# Patient Record
Sex: Female | Born: 1995 | Race: White | Hispanic: No | Marital: Single
Health system: Southern US, Community
[De-identification: ages and names within clinical notes are randomized; demographics above are authoritative.]

---
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
## 2014-02-04 ENCOUNTER — Emergency Department: Payer: Self-pay | Admitting: Emergency Medicine

## 2014-02-04 LAB — COMPREHENSIVE METABOLIC PANEL
ALK PHOS: 52 U/L
Albumin: 3.6 g/dL — ABNORMAL LOW (ref 3.8–5.6)
Anion Gap: 9 (ref 7–16)
BILIRUBIN TOTAL: 0.4 mg/dL (ref 0.2–1.0)
BUN: 10 mg/dL (ref 7–18)
Calcium, Total: 8.6 mg/dL — ABNORMAL LOW (ref 9.0–10.7)
Chloride: 106 mmol/L (ref 98–107)
Co2: 27 mmol/L (ref 21–32)
Creatinine: 1.07 mg/dL (ref 0.60–1.30)
EGFR (African American): >60
Glucose: 78 mg/dL (ref 65–99)
Osmolality: 281 (ref 275–301)
POTASSIUM: 3.6 mmol/L (ref 3.5–5.1)
SGOT(AST): 21 U/L (ref 0–26)
SGPT (ALT): 27 U/L
SODIUM: 142 mmol/L (ref 136–145)
Total Protein: 7.5 g/dL (ref 6.4–8.6)

## 2014-02-04 LAB — URINALYSIS, COMPLETE
Bilirubin,UR: NEGATIVE
Glucose,UR: NEGATIVE mg/dL (ref 0–75)
KETONE: NEGATIVE
Nitrite: NEGATIVE
PH: 7 (ref 4.5–8.0)
Protein: NEGATIVE
Specific Gravity: 1.004 (ref 1.003–1.030)
WBC UR: 18 /HPF (ref 0–5)

## 2014-02-04 LAB — CBC WITH DIFFERENTIAL/PLATELET
Basophil #: 0.1 10*3/uL (ref 0.0–0.1)
Basophil %: 1.2 %
EOS ABS: 0.2 10*3/uL (ref 0.0–0.7)
EOS PCT: 3.9 %
HCT: 39.8 % (ref 35.0–47.0)
HGB: 12.7 g/dL (ref 12.0–16.0)
Lymphocyte #: 2.3 10*3/uL (ref 1.0–3.6)
Lymphocyte %: 41.5 %
MCH: 27.4 pg (ref 26.0–34.0)
MCHC: 31.9 g/dL — ABNORMAL LOW (ref 32.0–36.0)
MCV: 86 fL (ref 80–100)
MONO ABS: 0.4 x10 3/mm (ref 0.2–0.9)
Monocyte %: 7.8 %
NEUTROS PCT: 45.6 %
Neutrophil #: 2.5 10*3/uL (ref 1.4–6.5)
Platelet: 306 10*3/uL (ref 150–440)
RBC: 4.63 10*6/uL (ref 3.80–5.20)
RDW: 13.8 % (ref 11.5–14.5)
WBC: 5.5 10*3/uL (ref 3.6–11.0)

## 2014-02-04 LAB — PREGNANCY, URINE: Pregnancy Test, Urine: NEGATIVE m[IU]/mL

## 2014-02-04 LAB — GC/CHLAMYDIA PROBE AMP

## 2014-02-04 LAB — LIPASE, BLOOD: Lipase: 69 U/L — ABNORMAL LOW (ref 73–393)

## 2014-02-04 LAB — WET PREP, GENITAL

## 2014-02-06 LAB — URINE CULTURE

## 2014-03-12 ENCOUNTER — Emergency Department: Payer: Self-pay | Admitting: Emergency Medicine

## 2014-03-12 LAB — URINALYSIS, COMPLETE
BILIRUBIN, UR: NEGATIVE
BLOOD: NEGATIVE
Glucose,UR: NEGATIVE mg/dL (ref 0–75)
Ketone: NEGATIVE
LEUKOCYTE ESTERASE: NEGATIVE
Nitrite: NEGATIVE
PH: 7 (ref 4.5–8.0)
PROTEIN: NEGATIVE
RBC,UR: 1 /HPF (ref 0–5)
Specific Gravity: 1.015 (ref 1.003–1.030)
Squamous Epithelial: 1

## 2014-03-12 LAB — PREGNANCY, URINE: Pregnancy Test, Urine: NEGATIVE m[IU]/mL

## 2014-03-12 LAB — COMPREHENSIVE METABOLIC PANEL
ALBUMIN: 3.8 g/dL (ref 3.8–5.6)
ALK PHOS: 60 U/L
ALT: 28 U/L
Anion Gap: 10 (ref 7–16)
BUN: 9 mg/dL (ref 7–18)
Bilirubin,Total: 0.3 mg/dL (ref 0.2–1.0)
CALCIUM: 9 mg/dL (ref 9.0–10.7)
CHLORIDE: 107 mmol/L (ref 98–107)
CREATININE: 0.76 mg/dL (ref 0.60–1.30)
Co2: 21 mmol/L (ref 21–32)
EGFR (African American): >60
GLUCOSE: 88 mg/dL (ref 65–99)
Osmolality: 274 (ref 275–301)
Potassium: 3.8 mmol/L (ref 3.5–5.1)
SGOT(AST): 31 U/L — ABNORMAL HIGH (ref 0–26)
Sodium: 138 mmol/L (ref 136–145)
Total Protein: 7.7 g/dL (ref 6.4–8.6)

## 2014-03-12 LAB — CBC
HCT: 38.3 % (ref 35.0–47.0)
HGB: 12.8 g/dL (ref 12.0–16.0)
MCH: 27.9 pg (ref 26.0–34.0)
MCHC: 33.5 g/dL (ref 32.0–36.0)
MCV: 83 fL (ref 80–100)
Platelet: 333 10*3/uL (ref 150–440)
RBC: 4.61 10*6/uL (ref 3.80–5.20)
RDW: 13.7 % (ref 11.5–14.5)
WBC: 9.9 10*3/uL (ref 3.6–11.0)

## 2014-03-12 LAB — CK TOTAL AND CKMB (NOT AT ARMC): CK, Total: 73 U/L

## 2014-03-12 LAB — TROPONIN I: Troponin-I: <0.02 ng/mL

## 2014-03-23 ENCOUNTER — Observation Stay: Payer: Self-pay | Admitting: Internal Medicine

## 2014-03-23 LAB — CBC WITH DIFFERENTIAL/PLATELET
BASOS PCT: 0.7 %
Basophil #: 0.1 10*3/uL (ref 0.0–0.1)
EOS ABS: 0.2 10*3/uL (ref 0.0–0.7)
EOS PCT: 1.9 %
HCT: 39.8 % (ref 35.0–47.0)
HGB: 12.7 g/dL (ref 12.0–16.0)
LYMPHS ABS: 2 10*3/uL (ref 1.0–3.6)
Lymphocyte %: 20.2 %
MCH: 27 pg (ref 26.0–34.0)
MCHC: 31.9 g/dL — AB (ref 32.0–36.0)
MCV: 85 fL (ref 80–100)
MONO ABS: 0.8 x10 3/mm (ref 0.2–0.9)
Monocyte %: 8.6 %
NEUTROS ABS: 6.7 10*3/uL — AB (ref 1.4–6.5)
NEUTROS PCT: 68.6 %
Platelet: 377 10*3/uL (ref 150–440)
RBC: 4.71 10*6/uL (ref 3.80–5.20)
RDW: 14 % (ref 11.5–14.5)
WBC: 9.8 10*3/uL (ref 3.6–11.0)

## 2014-03-23 LAB — URINALYSIS, COMPLETE
BACTERIA: NONE SEEN
Bilirubin,UR: NEGATIVE
Blood: NEGATIVE
GLUCOSE, UR: NEGATIVE mg/dL (ref 0–75)
Ketone: NEGATIVE
Nitrite: NEGATIVE
PROTEIN: NEGATIVE
Ph: 7 (ref 4.5–8.0)
RBC,UR: 8 /HPF (ref 0–5)
Specific Gravity: 1.026 (ref 1.003–1.030)
WBC UR: 5 /HPF (ref 0–5)

## 2014-03-23 LAB — DRUG SCREEN, URINE
AMPHETAMINES, UR SCREEN: NEGATIVE (ref ?–1000)
Barbiturates, Ur Screen: NEGATIVE (ref ?–200)
Benzodiazepine, Ur Scrn: NEGATIVE (ref ?–200)
CANNABINOID 50 NG, UR ~~LOC~~: NEGATIVE (ref ?–50)
COCAINE METABOLITE, UR ~~LOC~~: NEGATIVE (ref ?–300)
MDMA (Ecstasy)Ur Screen: NEGATIVE (ref ?–500)
Methadone, Ur Screen: NEGATIVE (ref ?–300)
Opiate, Ur Screen: NEGATIVE (ref ?–300)
Phencyclidine (PCP) Ur S: NEGATIVE (ref ?–25)
Tricyclic, Ur Screen: NEGATIVE (ref ?–1000)

## 2014-03-23 LAB — COMPREHENSIVE METABOLIC PANEL
ANION GAP: 7 (ref 7–16)
Albumin: 4.1 g/dL (ref 3.8–5.6)
Alkaline Phosphatase: 75 U/L
BILIRUBIN TOTAL: 0.3 mg/dL (ref 0.2–1.0)
BUN: 13 mg/dL (ref 7–18)
CALCIUM: 8.9 mg/dL — AB (ref 9.0–10.7)
Chloride: 107 mmol/L (ref 98–107)
Co2: 25 mmol/L (ref 21–32)
Creatinine: 0.88 mg/dL (ref 0.60–1.30)
EGFR (Non-African Amer.): >60
GLUCOSE: 89 mg/dL (ref 65–99)
Osmolality: 277 (ref 275–301)
POTASSIUM: 3.9 mmol/L (ref 3.5–5.1)
SGOT(AST): 57 U/L — ABNORMAL HIGH (ref 0–26)
SGPT (ALT): 124 U/L — ABNORMAL HIGH
Sodium: 139 mmol/L (ref 136–145)
TOTAL PROTEIN: 8 g/dL (ref 6.4–8.6)

## 2014-03-23 LAB — TROPONIN I
Troponin-I: <0.02 ng/mL
Troponin-I: <0.02 ng/mL

## 2014-03-23 LAB — CK TOTAL AND CKMB (NOT AT ARMC)
CK, Total: 50 U/L
CK, Total: 46 U/L
CK-MB: <0.5 ng/mL — ABNORMAL LOW (ref 0.5–3.6)

## 2014-03-23 LAB — LIPASE, BLOOD: Lipase: 108 U/L (ref 73–393)

## 2014-03-24 DIAGNOSIS — I341 Nonrheumatic mitral (valve) prolapse: Secondary | ICD-10-CM

## 2014-03-24 LAB — CK TOTAL AND CKMB (NOT AT ARMC): CK, TOTAL: 46 U/L

## 2014-03-24 LAB — TROPONIN I: Troponin-I: <0.02 ng/mL

## 2014-06-02 ENCOUNTER — Emergency Department: Payer: Self-pay | Admitting: Emergency Medicine

## 2014-06-02 LAB — DRUG SCREEN, URINE
Amphetamines, Ur Screen: NEGATIVE (ref ?–1000)
Barbiturates, Ur Screen: NEGATIVE (ref ?–200)
Benzodiazepine, Ur Scrn: NEGATIVE (ref ?–200)
Cannabinoid 50 Ng, Ur ~~LOC~~: NEGATIVE (ref ?–50)
Cocaine Metabolite,Ur ~~LOC~~: NEGATIVE (ref ?–300)
MDMA (Ecstasy)Ur Screen: POSITIVE (ref ?–500)
Methadone, Ur Screen: NEGATIVE (ref ?–300)
Opiate, Ur Screen: NEGATIVE (ref ?–300)
Phencyclidine (PCP) Ur S: NEGATIVE (ref ?–25)
Tricyclic, Ur Screen: NEGATIVE (ref ?–1000)

## 2014-06-02 LAB — COMPREHENSIVE METABOLIC PANEL
ALBUMIN: 4.4 g/dL (ref 3.8–5.6)
ALT: 55 U/L
Alkaline Phosphatase: 77 U/L
Anion Gap: 7 (ref 7–16)
BUN: 11 mg/dL (ref 7–18)
Bilirubin,Total: 0.4 mg/dL (ref 0.2–1.0)
CREATININE: 1.12 mg/dL (ref 0.60–1.30)
Calcium, Total: 9.1 mg/dL (ref 9.0–10.7)
Chloride: 104 mmol/L (ref 98–107)
Co2: 29 mmol/L (ref 21–32)
Glucose: 96 mg/dL (ref 65–99)
Osmolality: 279 (ref 275–301)
POTASSIUM: 3.7 mmol/L (ref 3.5–5.1)
SGOT(AST): 52 U/L — ABNORMAL HIGH (ref 0–26)
Sodium: 140 mmol/L (ref 136–145)
Total Protein: 8.7 g/dL — ABNORMAL HIGH (ref 6.4–8.6)

## 2014-06-02 LAB — URINALYSIS, COMPLETE
BILIRUBIN, UR: NEGATIVE
GLUCOSE, UR: NEGATIVE mg/dL (ref 0–75)
Ketone: NEGATIVE
Leukocyte Esterase: NEGATIVE
Nitrite: NEGATIVE
Ph: 8 (ref 4.5–8.0)
Protein: 30
RBC,UR: 20 /HPF (ref 0–5)
SPECIFIC GRAVITY: 1.021 (ref 1.003–1.030)
Squamous Epithelial: 18
WBC UR: 4 /HPF (ref 0–5)

## 2014-06-02 LAB — CBC WITH DIFFERENTIAL/PLATELET
Basophil #: 0.1 10*3/uL (ref 0.0–0.1)
Basophil %: 0.9 %
Eosinophil #: 0.1 10*3/uL (ref 0.0–0.7)
Eosinophil %: 0.7 %
HCT: 39 % (ref 35.0–47.0)
HGB: 12.5 g/dL (ref 12.0–16.0)
Lymphocyte #: 2.5 10*3/uL (ref 1.0–3.6)
Lymphocyte %: 21.7 %
MCH: 26.3 pg (ref 26.0–34.0)
MCHC: 32.1 g/dL (ref 32.0–36.0)
MCV: 82 fL (ref 80–100)
MONOS PCT: 7.7 %
Monocyte #: 0.9 x10 3/mm (ref 0.2–0.9)
NEUTROS ABS: 8 10*3/uL — AB (ref 1.4–6.5)
NEUTROS PCT: 69 %
PLATELETS: 388 10*3/uL (ref 150–440)
RBC: 4.75 10*6/uL (ref 3.80–5.20)
RDW: 14.8 % — ABNORMAL HIGH (ref 11.5–14.5)
WBC: 11.6 10*3/uL — AB (ref 3.6–11.0)

## 2014-06-02 LAB — PROTIME-INR
INR: 1
Prothrombin Time: 13.2 secs (ref 11.5–14.7)

## 2014-06-02 LAB — CK TOTAL AND CKMB (NOT AT ARMC)
CK, Total: 88 U/L (ref 26–192)
CK-MB: <0.5 ng/mL — ABNORMAL LOW (ref 0.5–3.6)

## 2014-06-02 LAB — PREGNANCY, URINE: Pregnancy Test, Urine: NEGATIVE m[IU]/mL

## 2014-06-02 LAB — D-DIMER(ARMC): D-Dimer: 116 ng/mL

## 2014-06-02 LAB — TROPONIN I: Troponin-I: <0.02 ng/mL

## 2014-09-06 ENCOUNTER — Emergency Department: Payer: Self-pay | Admitting: Emergency Medicine

## 2014-10-15 NOTE — H&P (Signed)
PATIENT NAME:  Ellen Buchanan, Ellen Buchanan MR#:  [id number] DATE OF BIRTH:  [birth year]  DATE OF ADMISSION:  03/23/2014  REFERRING PHYSICIAN: Gladstone Pih, MD  PRIMARY CARE PHYSICIAN: Letitia Caul, MD   CHIEF COMPLAINT: Chest pain.   HISTORY OF PRESENT ILLNESS: Ellen Buchanan is a 19 year old female with a history of congenital aortic stenosis followed at [facility] who comes to the Emergency Department with complaints of chest pain. 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 The patient states continues to have constant pain on the whole left side of the chest associated with shortness of breath. The patient states that this was associated with dizziness and had an episode of syncope. The patient denies having any palpitations, nausea, vomiting, abdominal pain. The patient denies any previous episodes of syncope. The patient states has been experiencing chest pain for the last 6 months off and on. Gets this chest pain. Usually lasts about 2 to 3 days. Without any intervention the pain resolves. The patient was told that the patient has mild aortic stenosis, congenital.   PAST MEDICAL HISTORY:  1.  Congenital aortic valve stenosis.  2.  Asthma.   PAST SURGICAL HISTORY: None.   ALLERGIES: 1.  HYDROCODONE.  2.  MORPHINE.  3.  PREDNISONE.  4.  ALBUTEROL.   HOME MEDICATIONS:  1.  Prilosec 40 mg once a day.  2.  Meloxicam 7.5 mg daily.  3.  Ativan 0.5 mg 2 times a day as needed.   SOCIAL HISTORY: No history of smoking, drinking alcohol, or using illicit drugs. Lives with her grandmother. Works at [employer].   FAMILY HISTORY: The patient states both parents are healthy.   REVIEW OF SYSTEMS:  CONSTITUTIONAL: Denies any generalized weakness.  EYES: No change in vision.  ENT: No change in hearing.  RESPIRATORY: No cough, shortness of breath.  CARDIOVASCULAR: Complains of chest pain.  GASTROINTESTINAL: No nausea, vomiting, abdominal pain.  GENITOURINARY: No dysuria or  hematuria.  HEMATOLOGIC: No easy bruising or bleeding.  SKIN: No rash or lesions. MUSCULOSKELETAL: No joint pains and aches.  NEUROLOGIC: No weakness or numbness in any part of the body.   PHYSICAL EXAMINATION:  GENERAL: This is a well-built, well-nourished, age-appropriate female lying down in the bed, not in distress.  VITAL SIGNS: Temperature 98.3, pulse 79, blood pressure 180/70, respiratory rate of 20, oxygen saturation is 99% on room air.  HEENT: Head normocephalic, atraumatic. There is no scleral icterus. Conjunctivae normal. Pupils equal and react to light. Mucous membranes moist. No pharyngeal erythema.  NECK: Supple. No lymphadenopathy. No JVD. No carotid bruit.  CHEST: Has focal tenderness under the left breast. LUNGS: Bilaterally clear to auscultation.  HEART: S1, S2 regular. No murmurs are heard.  ABDOMEN: Bowel sounds plus. Soft, nontender, nondistended.  EXTREMITIES: No pedal edema. Pulses 2+.  NEUROLOGIC: The patient is alert, oriented to place, person, and time. Cranial nerves II through XII intact. Motor 5/5 in upper and lower extremities.   LABORATORIES: Urine drug screen is negative. CBC, CMP are completely within normal limits. Chest x-ray, 1 view, portable: No acute cardiopulmonary disease.   ASSESSMENT AND PLAN: Ellen Buchanan, a 19 year old female who comes with the chest pain. 1.  The patient was told that patient has mild aortic stenosis. Concerning about the patient's syncope, admit the patient to the monitored bed. Obtain echocardiogram in the morning. We will also obtain cardiac enzymes x 3. If workup is negative, the patient could be discharged home. The patient [facility]  [facility].  2.  Keep the patient on deep vein thrombosis prophylaxis with Lovenox.   TIME SPENT: 45 minutes.    ____________________________ Susa GriffinsPadmaja Kurtiss Wence, MD pv:ST D: 03/24/2014 00:05:18 ET T: 03/24/2014 00:26:46 ET JOB#: [id number]  cc: Susa GriffinsPadmaja Katoya Amato,  MD, <Dictator> Susa GriffinsPADMAJA Auren Valdes MD ELECTRONICALLY SIGNED 03/25/2014 22:58

## 2014-10-15 NOTE — Discharge Summary (Signed)
PATIENT NAME:  Ellen Buchanan, Sidda D MR#:  [id number] DATE OF BIRTH:  [birth year]  DATE OF ADMISSION:  03/23/2014 DATE OF DISCHARGE:  03/24/2014.    ADMISSION DIAGNOSIS: Chest pain.   DISCHARGE DIAGNOSES:   1.  Chest pain.   2.  History of congenital heart valve.    PERTINENT LABORATORIES:  Troponins x 3 were negative.   ECHO normal EF 60% with borderline or no aortic stenosis, hard to visualize bicuspid aortic valve.  HOSPITAL COURSE:  A 19 year old female who presented with chest pain.  For details see the H and P.    1.  Chest pain, atypical in nature, unspecified. The patient has a history of congenital aortic stenosis, so she was admitted for an echocardiogram to rule out acute coronary syndrome.  She has been ruled out for acute coronary syndrome and she will be [facility] for continued monitoring of her heart valve Telemetry and EKG were normal.  2.  Congenital aortic stenosis. The patient did obtain an echocardiogram which showed no or mild aortic stenosis. She stopped seeing cardiology 2 years ago. I discussed it would be in her best interest to have periodic cardiology follow up. She agreed and will make an appointment to [facility].  DISCHARGE MEDICATIONS:  1.  Prilosec 40 mg daily.   2.  Ativan 0.5 b.i.d.  3.  Meloxicam 7.5 daily.   DISCHARGE DIET: Regular diet.    DISCHARGE ACTIVITY: As tolerated.   DISCHARGE FOLLOWUP:  The patient is to follow up in 2-4 weeks with [facility] seem to cardiology.   TIME SPENT: Approximately 35 minutes.   Stablee for discharge home. Plan of care discussed with family.  ____________________________ Janyth ContesSital P. Juliene PinaMody, MD spm:bu D: 03/24/2014 12:03:00 ET T: 03/24/2014 16:22:25 ET JOB#: [id number]  cc: Hulet Ehrmann P. Juliene PinaMody, MD, <Dictator> [facility] Yarlin Breisch P Sharnetta Gielow MD ELECTRONICALLY SIGNED 03/24/2014 21:49

## 2016-05-09 IMAGING — US US PELV - US TRANSVAGINAL
1 series · 14 of 25 positions shown · non-contrast
Comparison: CT scan the abdomen and pelvis of today's date.

CLINICAL DATA: Lower abdominal pain



[Series 1: us pelv - us transvaginal · 0.22mm/px · 14 of 82 slices shown]
[im 1/82]
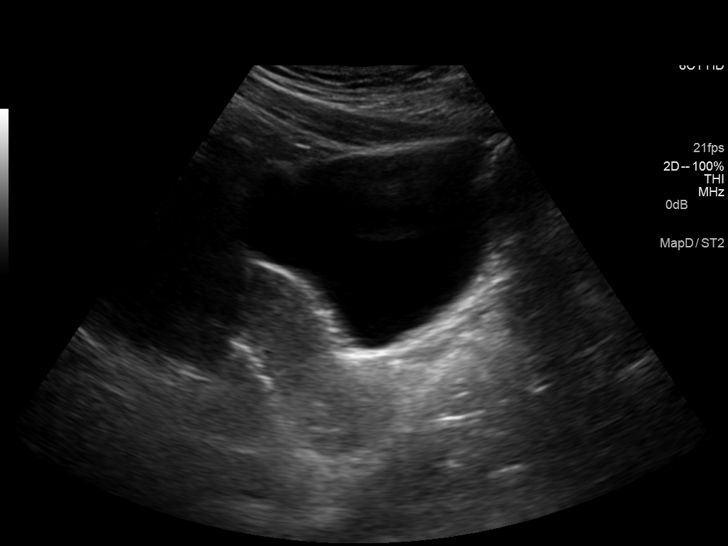
[im 7/82]
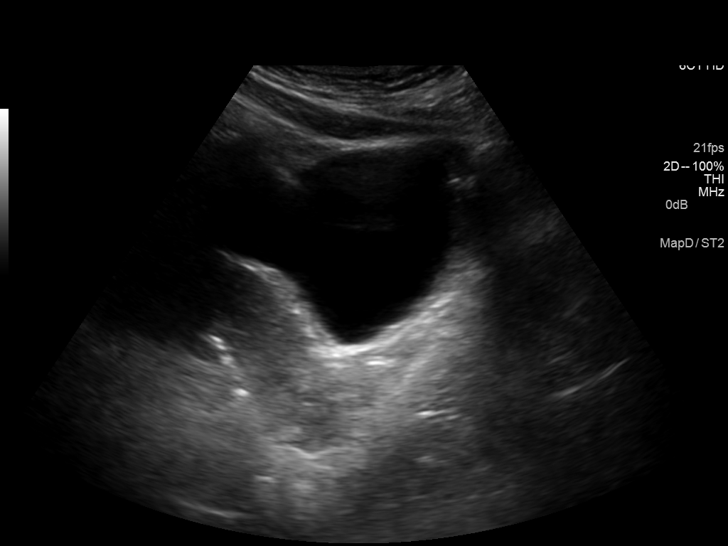
[im 14/82]
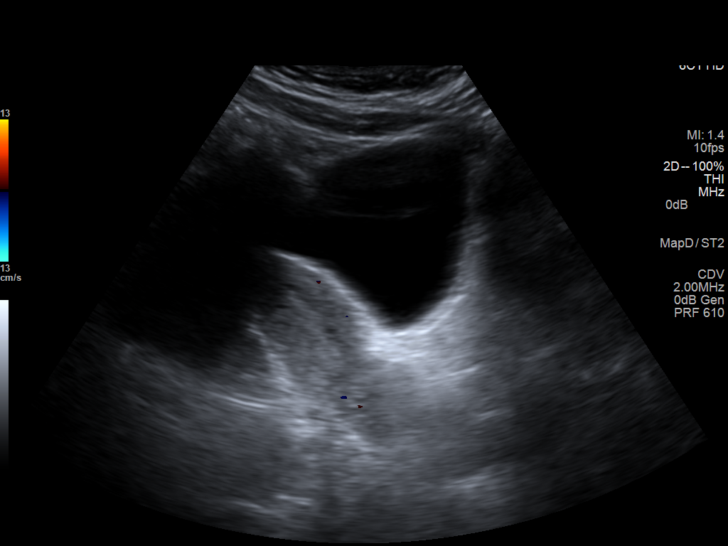
[im 21/82]
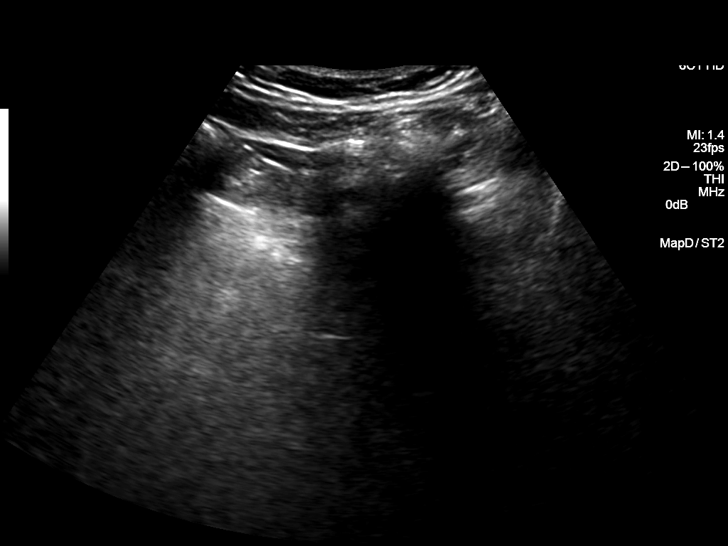
[im 28/82]
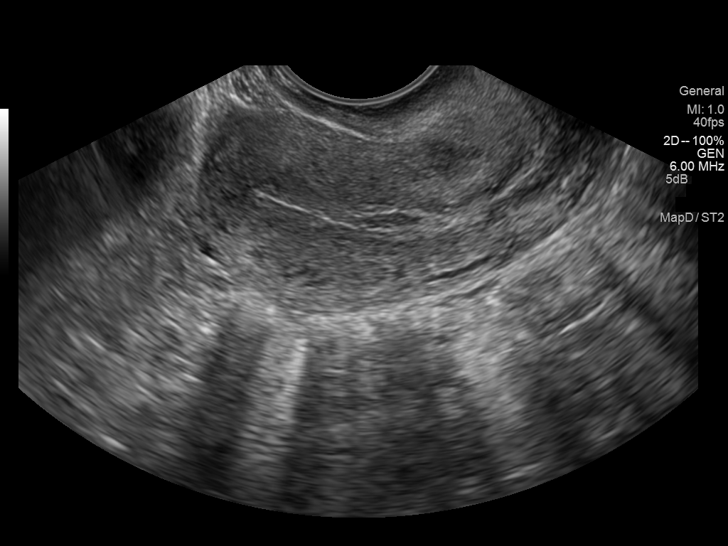
[im 31/82]
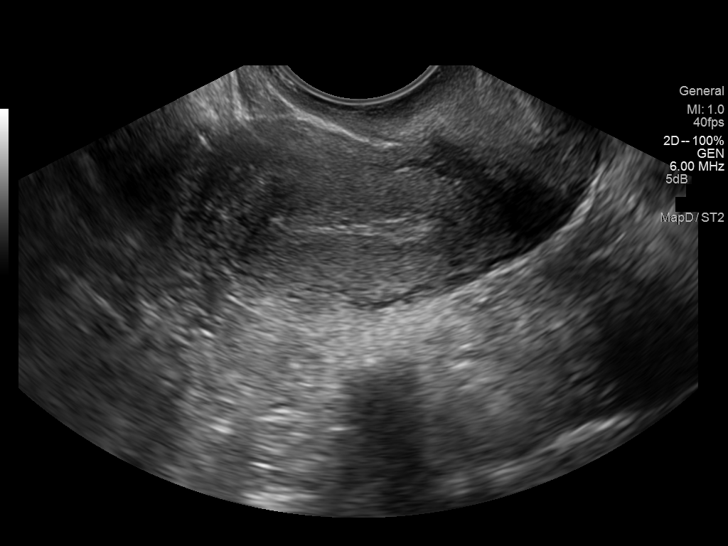
[im 38/82]
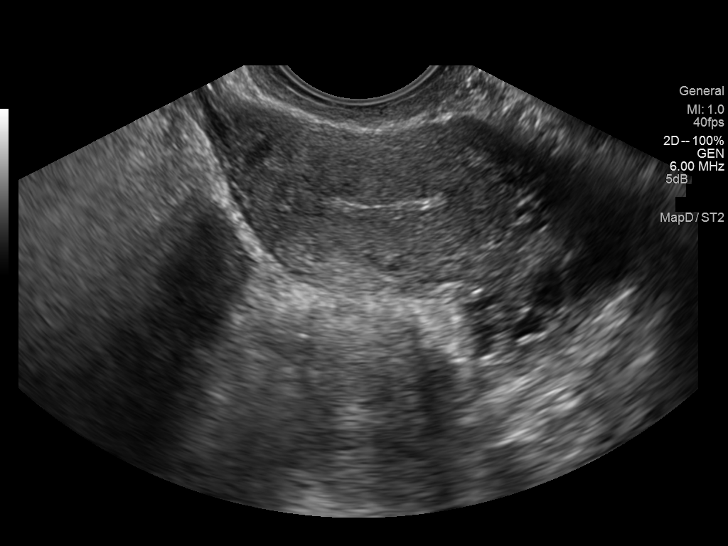
[im 44/82]
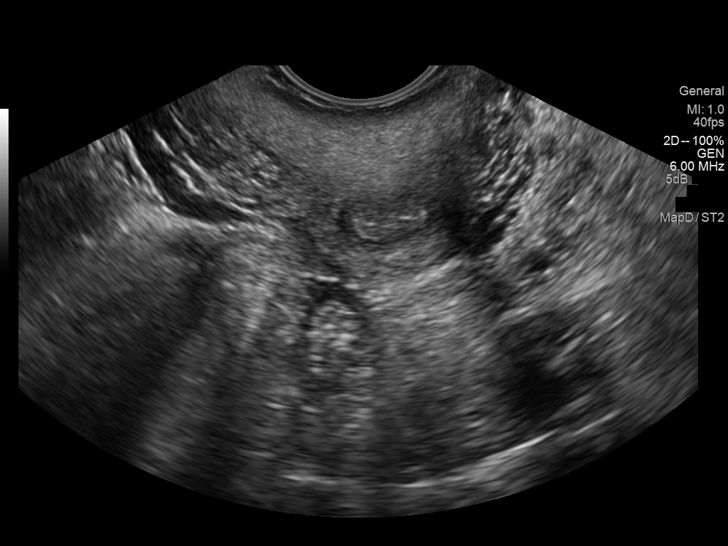
[im 51/82]
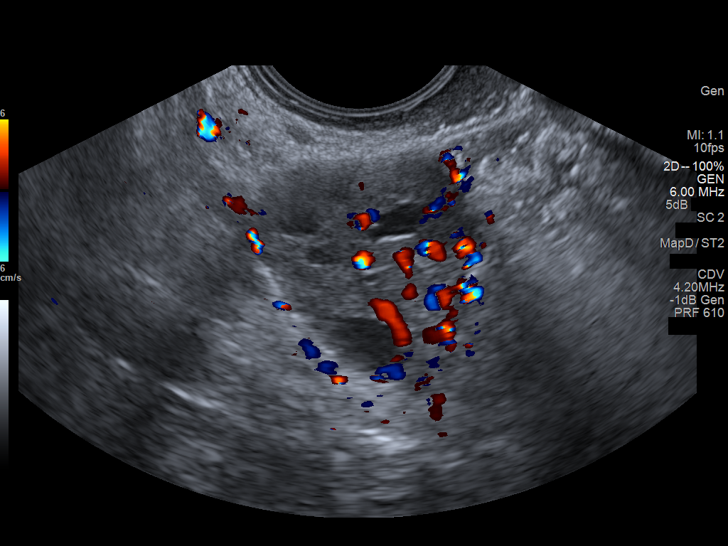
[im 55/82]
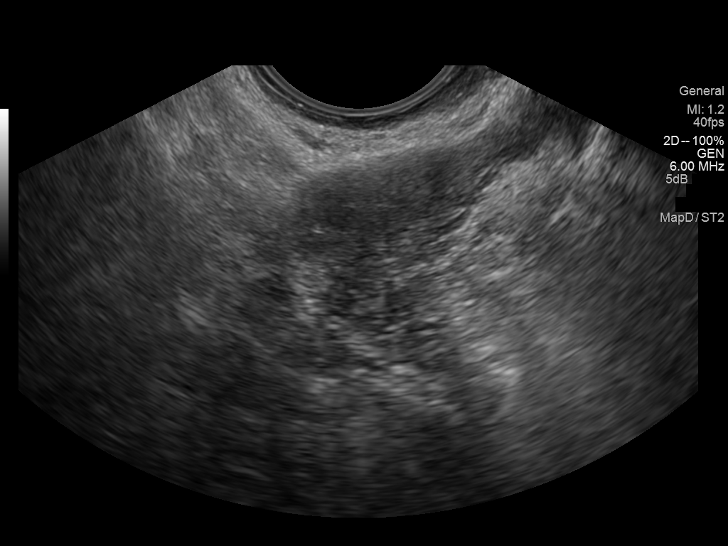
[im 61/82]
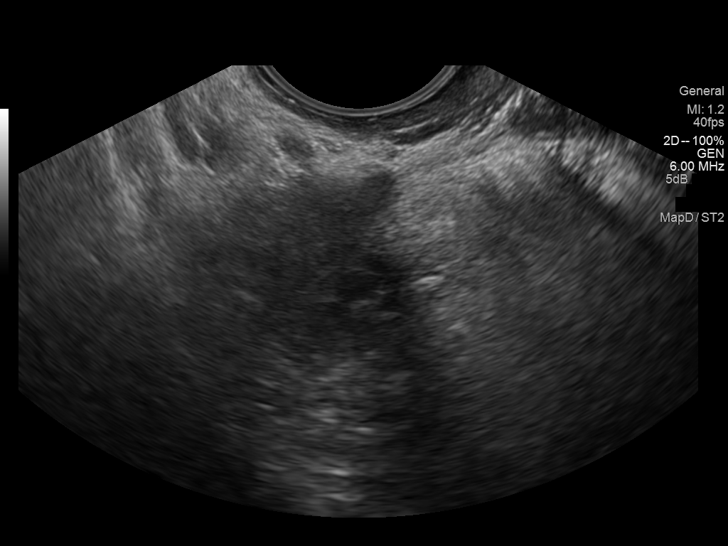
[im 68/82]
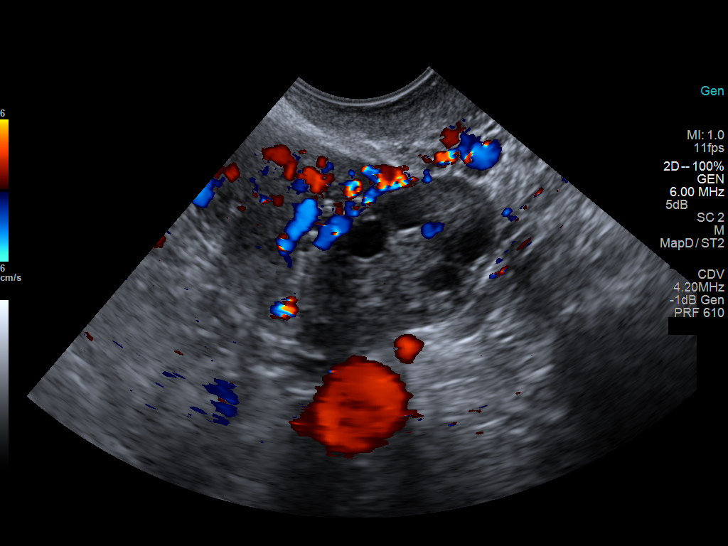
[im 75/82]
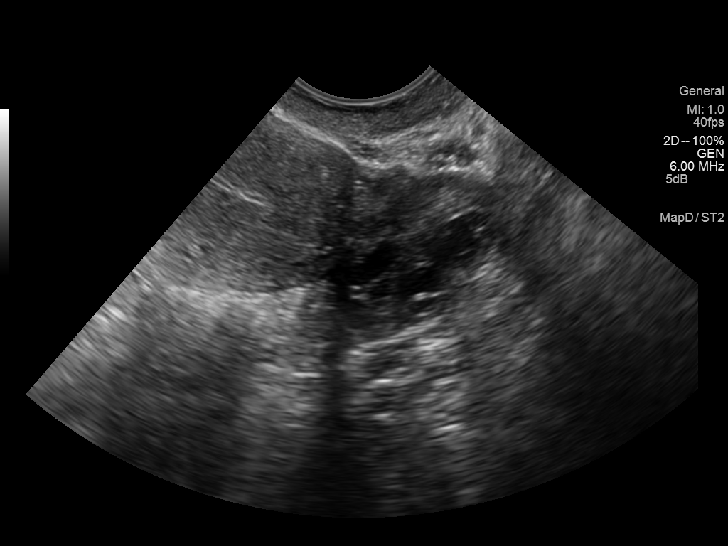
[im 82/82]
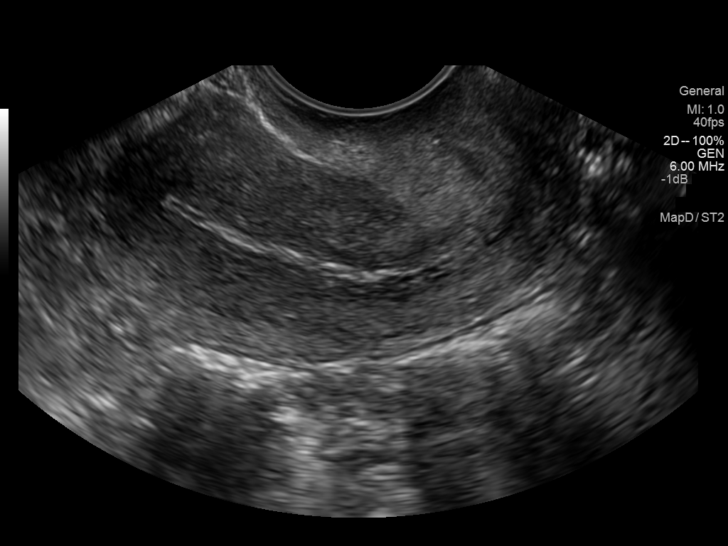

[14 of 25 positions shown; findings below may reference images not displayed]

FINDINGS: Uterus

Measurements: 7.0 x 2.6 x 4.9 cm. No fibroids or other mass
visualized.

Endometrium

Thickness: 5.3 mm.  No focal abnormality visualized.

Right ovary

Measurements: 3.1 x 2.7 x 2.6 cm. There are developing follicles
present. Vascularity is normal.

Left ovary

Measurements: 3.5 x 1.9 x 2.4 cm. Developing follicles are present.
Vascularity is normal.

Other findings

There is a trace of free pelvic fluid.
IMPRESSION: Normal pelvic ultrasound. A tiny amount of free pelvic fluid is
demonstrated. There are no findings to explain the patient's lower
abdominal discomfort.

## 2016-05-09 IMAGING — CT CT ABD-PELV W/ CM
2 of 4 series · 15 of 46 positions shown, 17 images · IV contrast (agent unspecified)
Comparison: None.

CLINICAL DATA: Right lower quadrant pain

EXAM:
CT ABDOMEN AND PELVIS WITH CONTRAST
TECHNIQUE: Multidetector CT imaging of the abdomen and pelvis was performed
using the standard protocol following bolus administration of
intravenous contrast.
CONTRAST:  100 cc of Qsovue-LCC intravenously. The patient also
received oral contrast material.

[Series 2: routine abd pel with · axial · 0.68mm/px · z∈[-546,-150]mm · 12 of 91 slices shown, 14 images]
[im 8/91  soft-tissue]
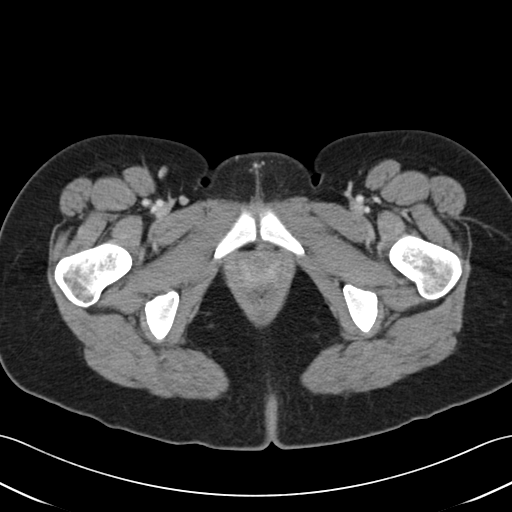
[im 8/91  bone]
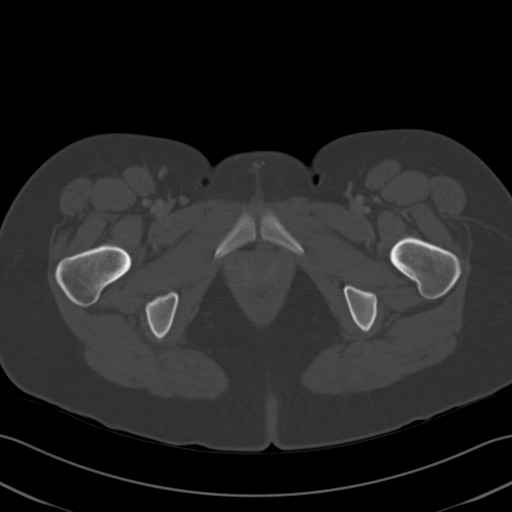
[im 15/91  soft-tissue]
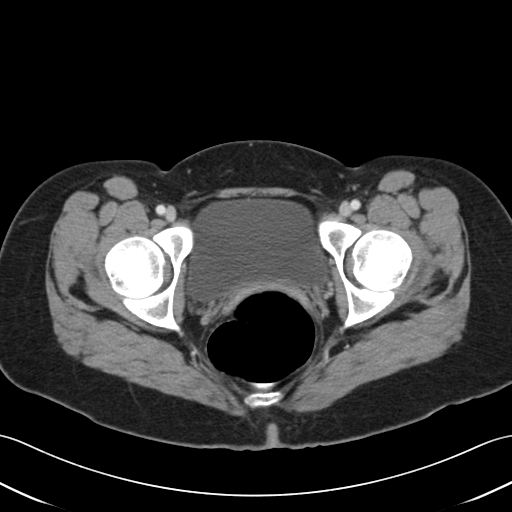
[im 22/91  soft-tissue]
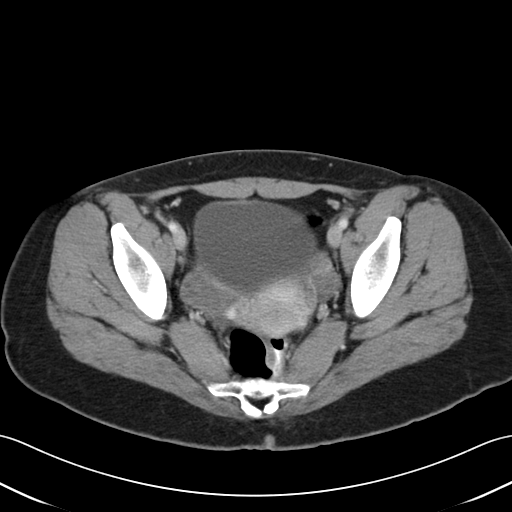
[im 29/91  soft-tissue]
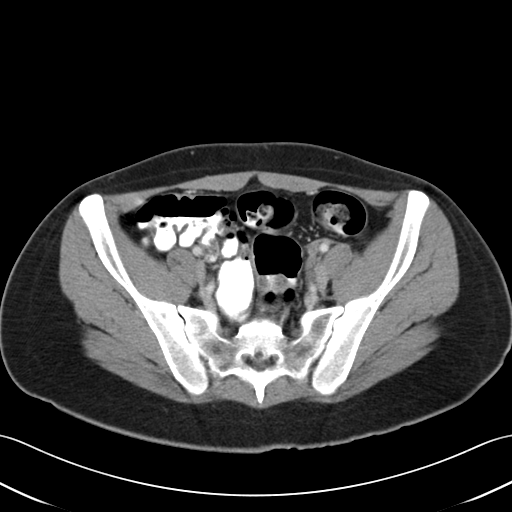
[im 37/91  soft-tissue]
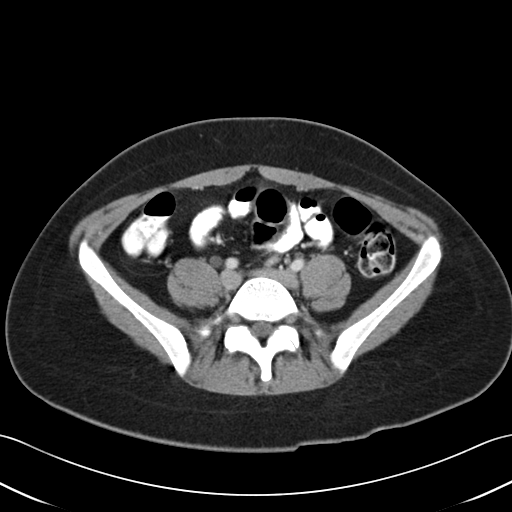
[im 44/91  soft-tissue]
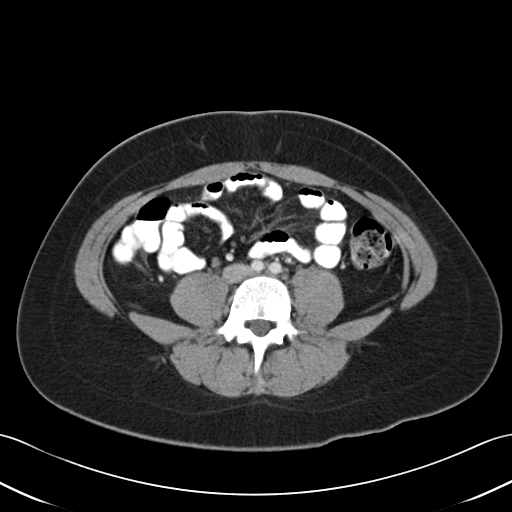
[im 51/91  soft-tissue]
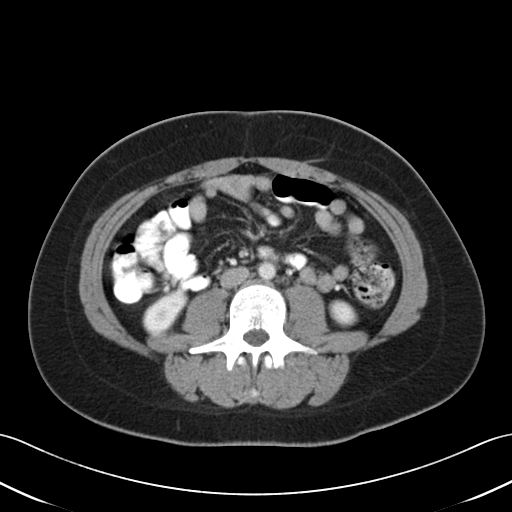
[im 58/91  soft-tissue]
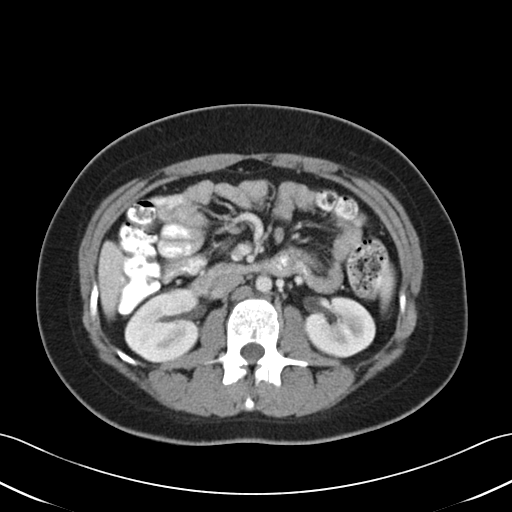
[im 65/91  soft-tissue]
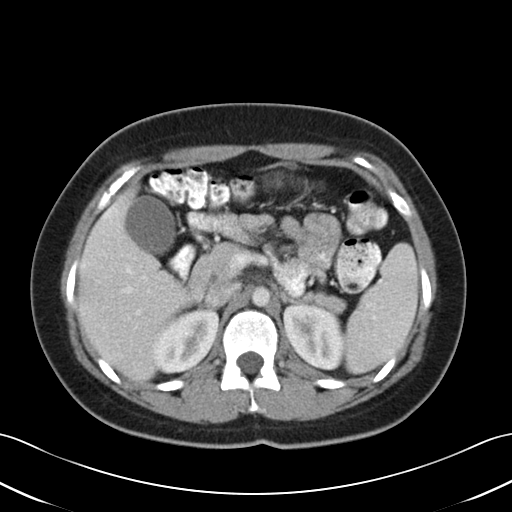
[im 65/91  bone]
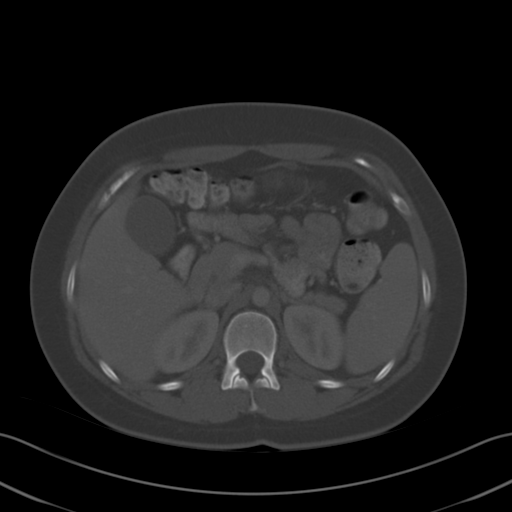
[im 73/91  soft-tissue]
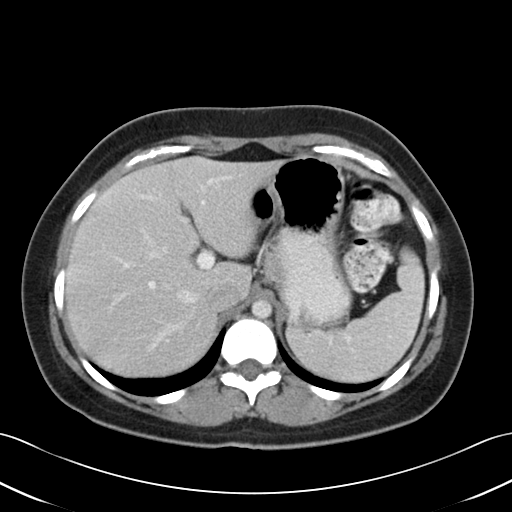
[im 80/91  soft-tissue]
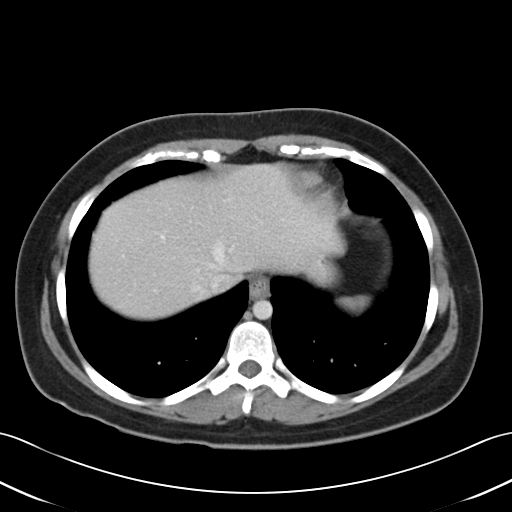
[im 87/91  soft-tissue]
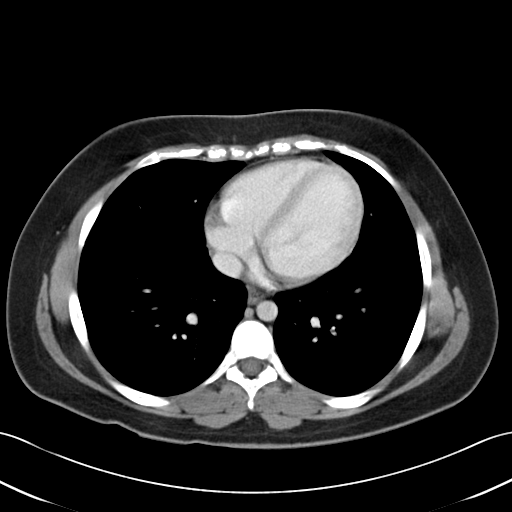

[Series 4: cor routine abd pel with · coronal · 0.60mm/px · 3 of 144 slices shown]
[im 48/144  soft-tissue]
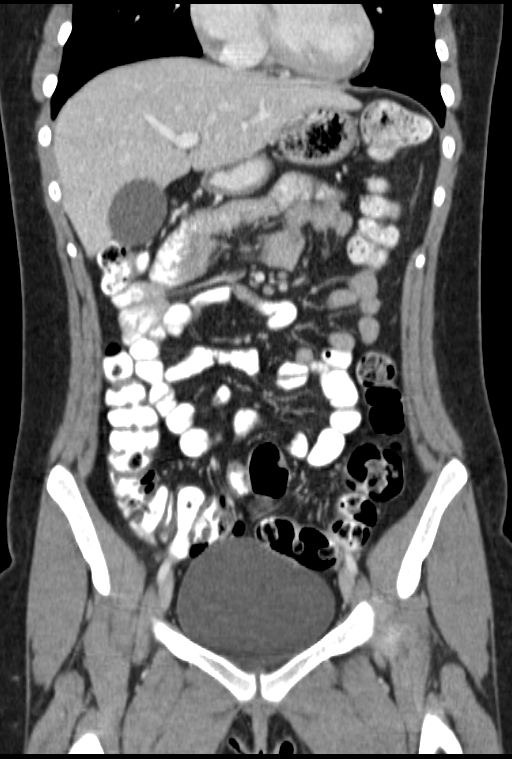
[im 64/144  soft-tissue]
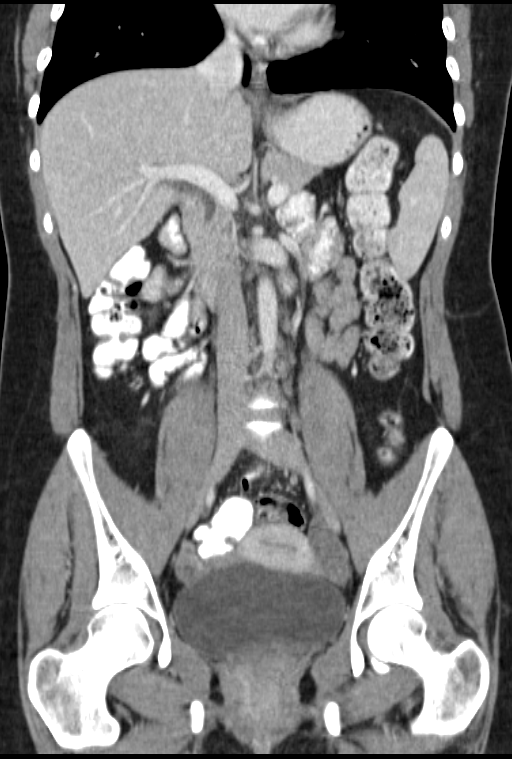
[im 80/144  soft-tissue]
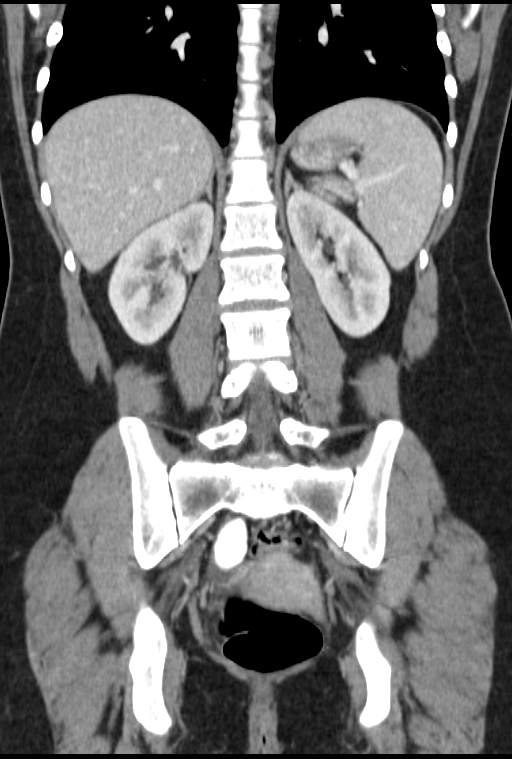

[15 of 46 positions shown; findings below may reference images not displayed]

FINDINGS: The liver exhibits no focal mass or ductal dilation. The
gallbladder, spleen, pancreas, partially distended stomach, and
adrenal glands are normal. The abdominal aorta is normal. The
periaortic and pericaval regions are normal. There is mild splitting
of the intrarenal collecting system on the right and there is
minimal right hydroureter. No calcified stones are evident. The left
kidney is normal. The urinary bladder is partially distended and
normal.

The small and large bowel exhibit no evidence of ileus nor
obstruction. The cecum is located deep within the pelvis. The
terminal ileum is normal. A normal calibered partially gas-filled
appendix is demonstrated in the presacral soft tissues.

The uterus is normal in density and contour. There is soft tissue
fullness in the adnexal regions which may reflect cystic ovarian
processes. There is no free pelvic fluid or inflammatory change in
the surrounding fat.

The lung bases are clear. The lumbar spine and bony pelvis are
unremarkable.
IMPRESSION: *There is no acute appendicitis nor other acute bowel abnormality.
*There is minimal right-sided hydronephrosis and hydroureter without
evidence of a stone. The findings may be normal for the patient but
recent passage of a stone is not excluded. Correlation with the
patient's urinalysis is needed.
*There is mild soft tissue fullness in both adnexal regions without
discrete abnormal masses or inflammatory change. Pelvic ultrasound
could be considered if further imaging is desired.
